# Patient Record
Sex: Male | Born: 1945 | Race: White | Hispanic: No | Marital: Married | Smoking: Former smoker
Health system: Southern US, Community
[De-identification: ages and names within clinical notes are randomized; demographics above are authoritative.]

## PROBLEM LIST (undated history)

## (undated) DIAGNOSIS — F0390 Unspecified dementia without behavioral disturbance: Secondary | ICD-10-CM

## (undated) DIAGNOSIS — G629 Polyneuropathy, unspecified: Secondary | ICD-10-CM

## (undated) DIAGNOSIS — F4310 Post-traumatic stress disorder, unspecified: Secondary | ICD-10-CM

## (undated) DIAGNOSIS — R918 Other nonspecific abnormal finding of lung field: Secondary | ICD-10-CM

---

## 2004-04-22 ENCOUNTER — Ambulatory Visit (HOSPITAL_COMMUNITY): Admission: RE | Admit: 2004-04-22 | Discharge: 2004-04-22 | Payer: Self-pay | Admitting: Ophthalmology

## 2005-12-23 ENCOUNTER — Emergency Department (HOSPITAL_COMMUNITY): Admission: EM | Admit: 2005-12-23 | Discharge: 2005-12-23 | Payer: Self-pay | Admitting: Emergency Medicine

## 2007-04-01 ENCOUNTER — Emergency Department (HOSPITAL_COMMUNITY): Admission: EM | Admit: 2007-04-01 | Discharge: 2007-04-01 | Payer: Self-pay | Admitting: Emergency Medicine

## 2008-04-09 ENCOUNTER — Emergency Department (HOSPITAL_COMMUNITY): Admission: EM | Admit: 2008-04-09 | Discharge: 2008-04-09 | Payer: Self-pay | Admitting: Internal Medicine

## 2009-03-05 ENCOUNTER — Emergency Department (HOSPITAL_COMMUNITY): Admission: EM | Admit: 2009-03-05 | Discharge: 2009-03-05 | Payer: Self-pay | Admitting: Emergency Medicine

## 2009-03-10 ENCOUNTER — Emergency Department (HOSPITAL_COMMUNITY): Admission: EM | Admit: 2009-03-10 | Discharge: 2009-03-10 | Payer: Self-pay | Admitting: Emergency Medicine

## 2009-03-27 ENCOUNTER — Emergency Department: Payer: Self-pay | Admitting: Emergency Medicine

## 2009-04-06 ENCOUNTER — Emergency Department: Payer: Self-pay | Admitting: Emergency Medicine

## 2009-12-17 IMAGING — CR DG HAND COMPLETE 3+V*L*
3 series · 3 of 3 positions shown · non-contrast
Comparison: None

CLINICAL DATA: Penetrating nail gun injury.

LEFT HAND - COMPLETE 3+ VIEW

[view not recorded (1 of 3)]
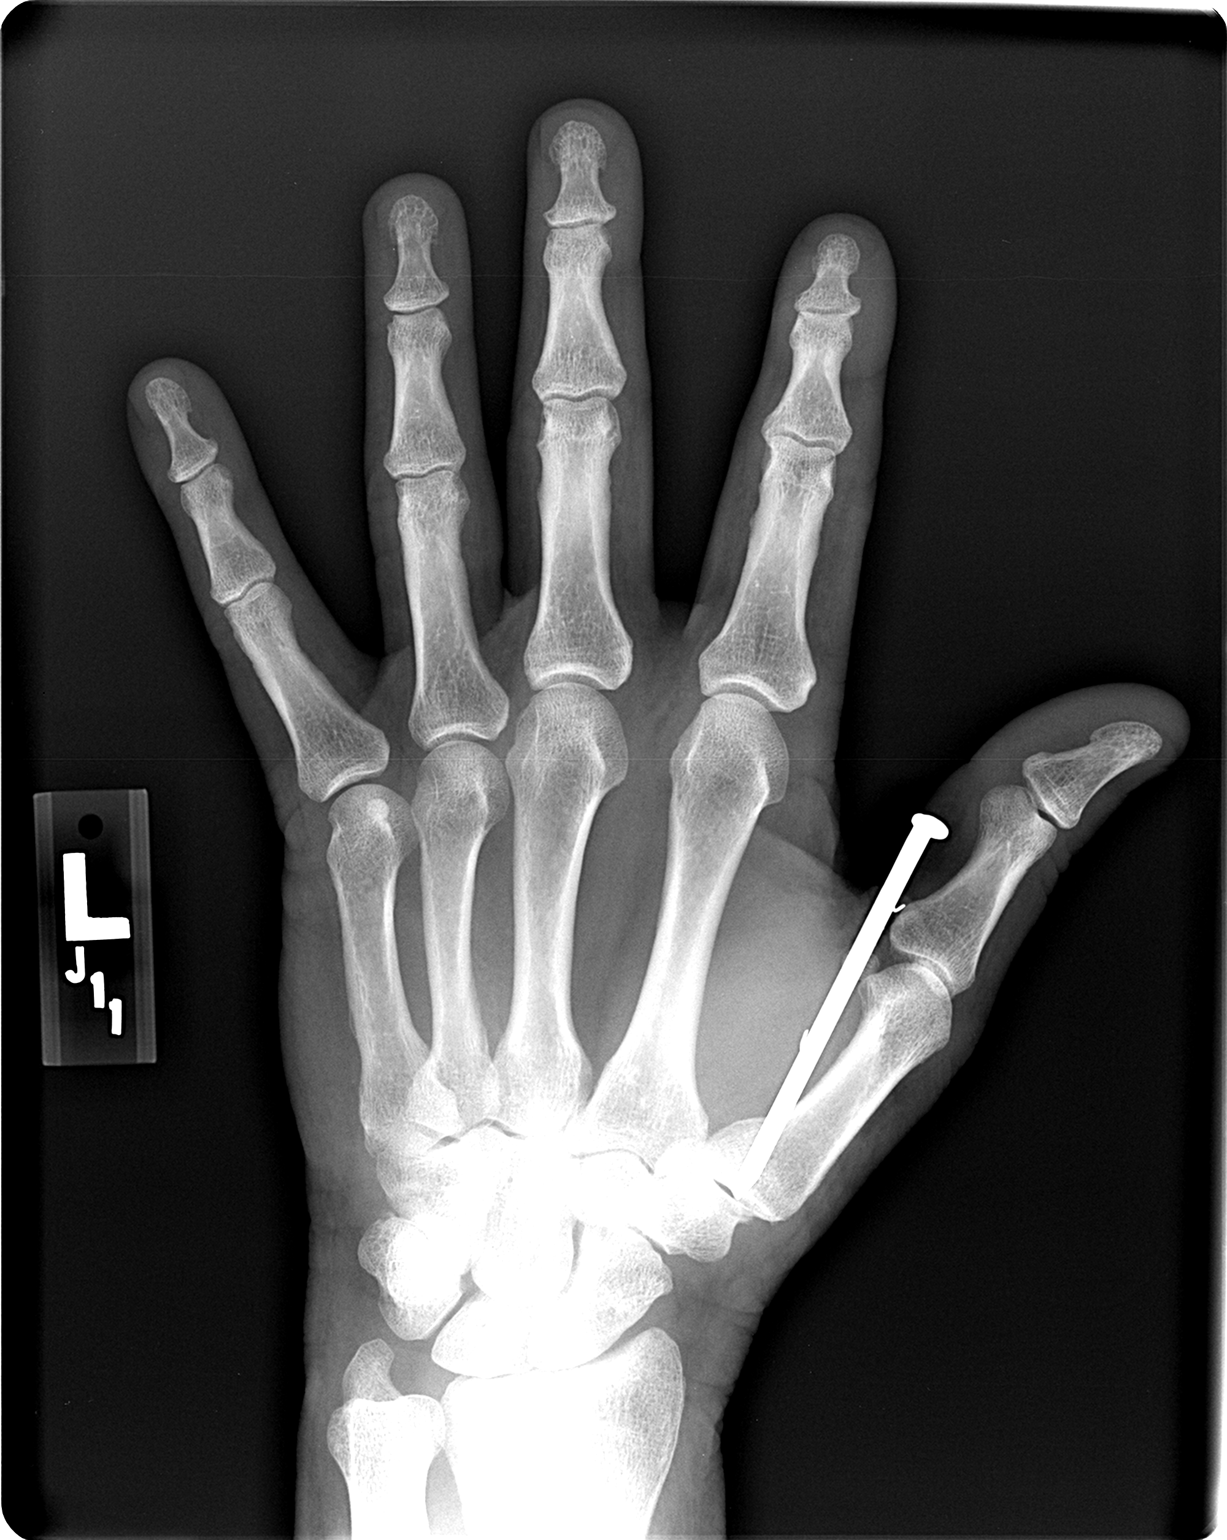

[view not recorded (2 of 3)]
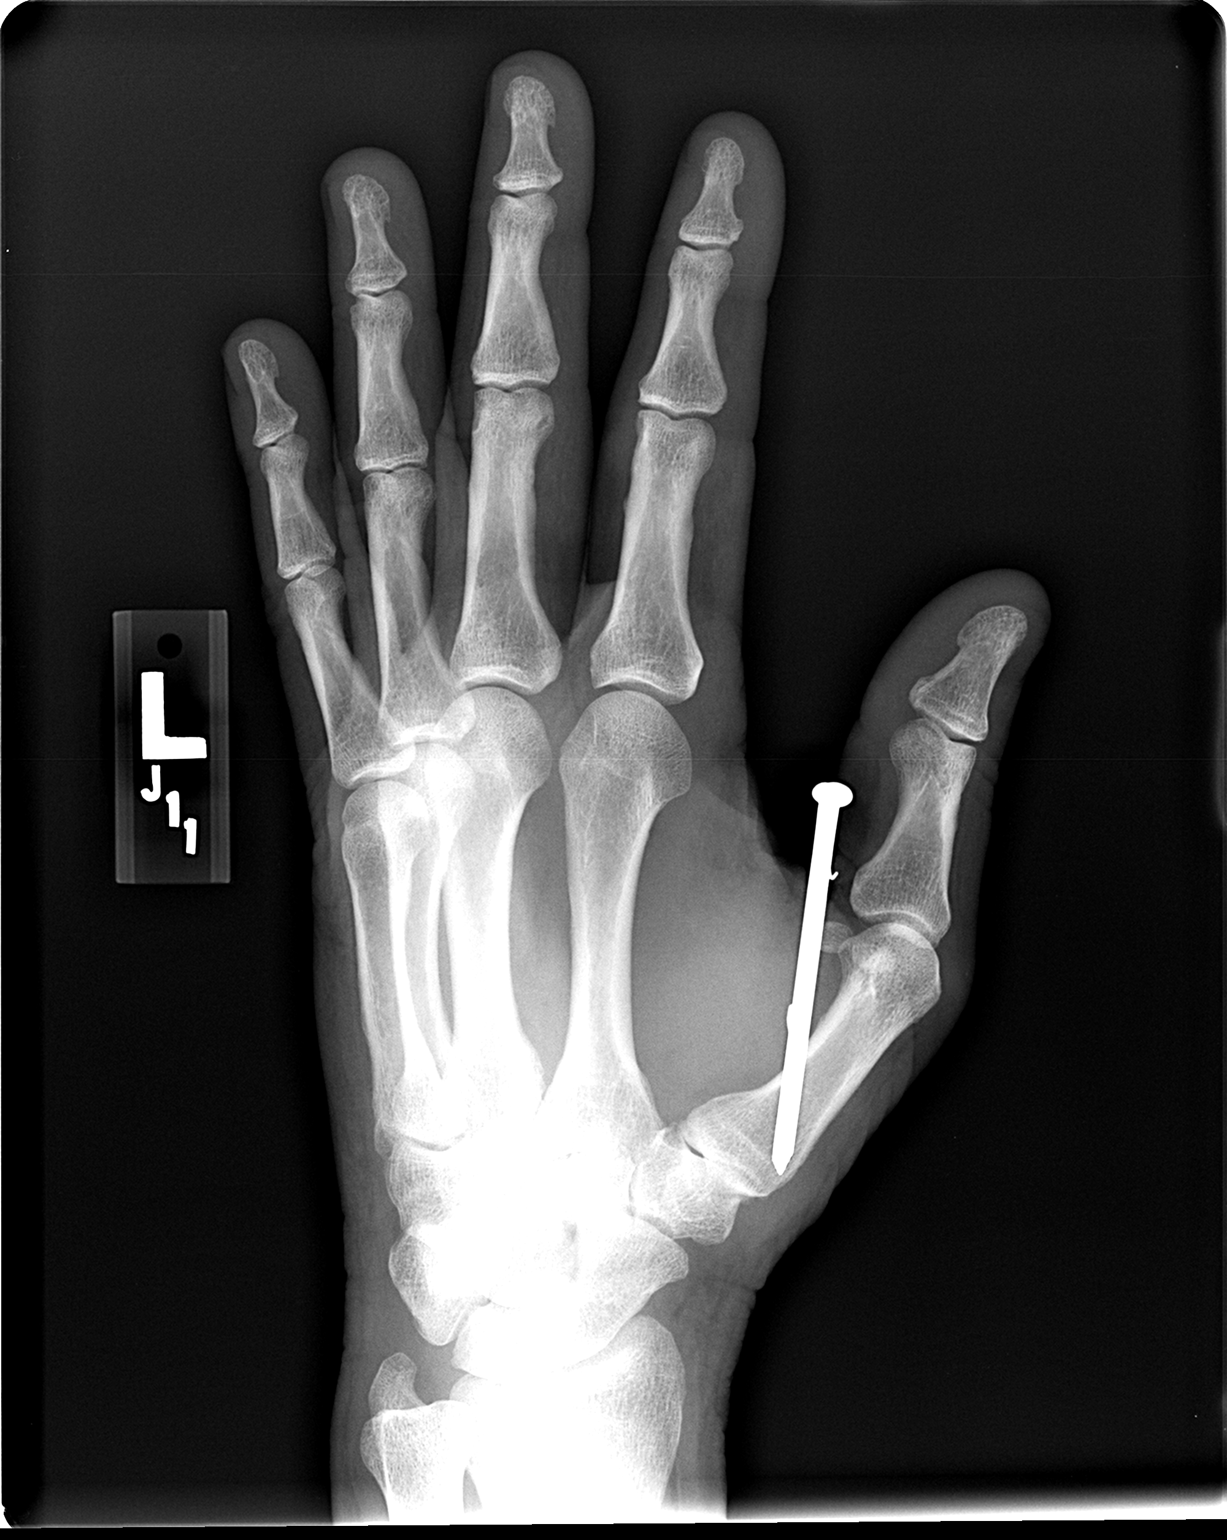

[view not recorded (3 of 3)]
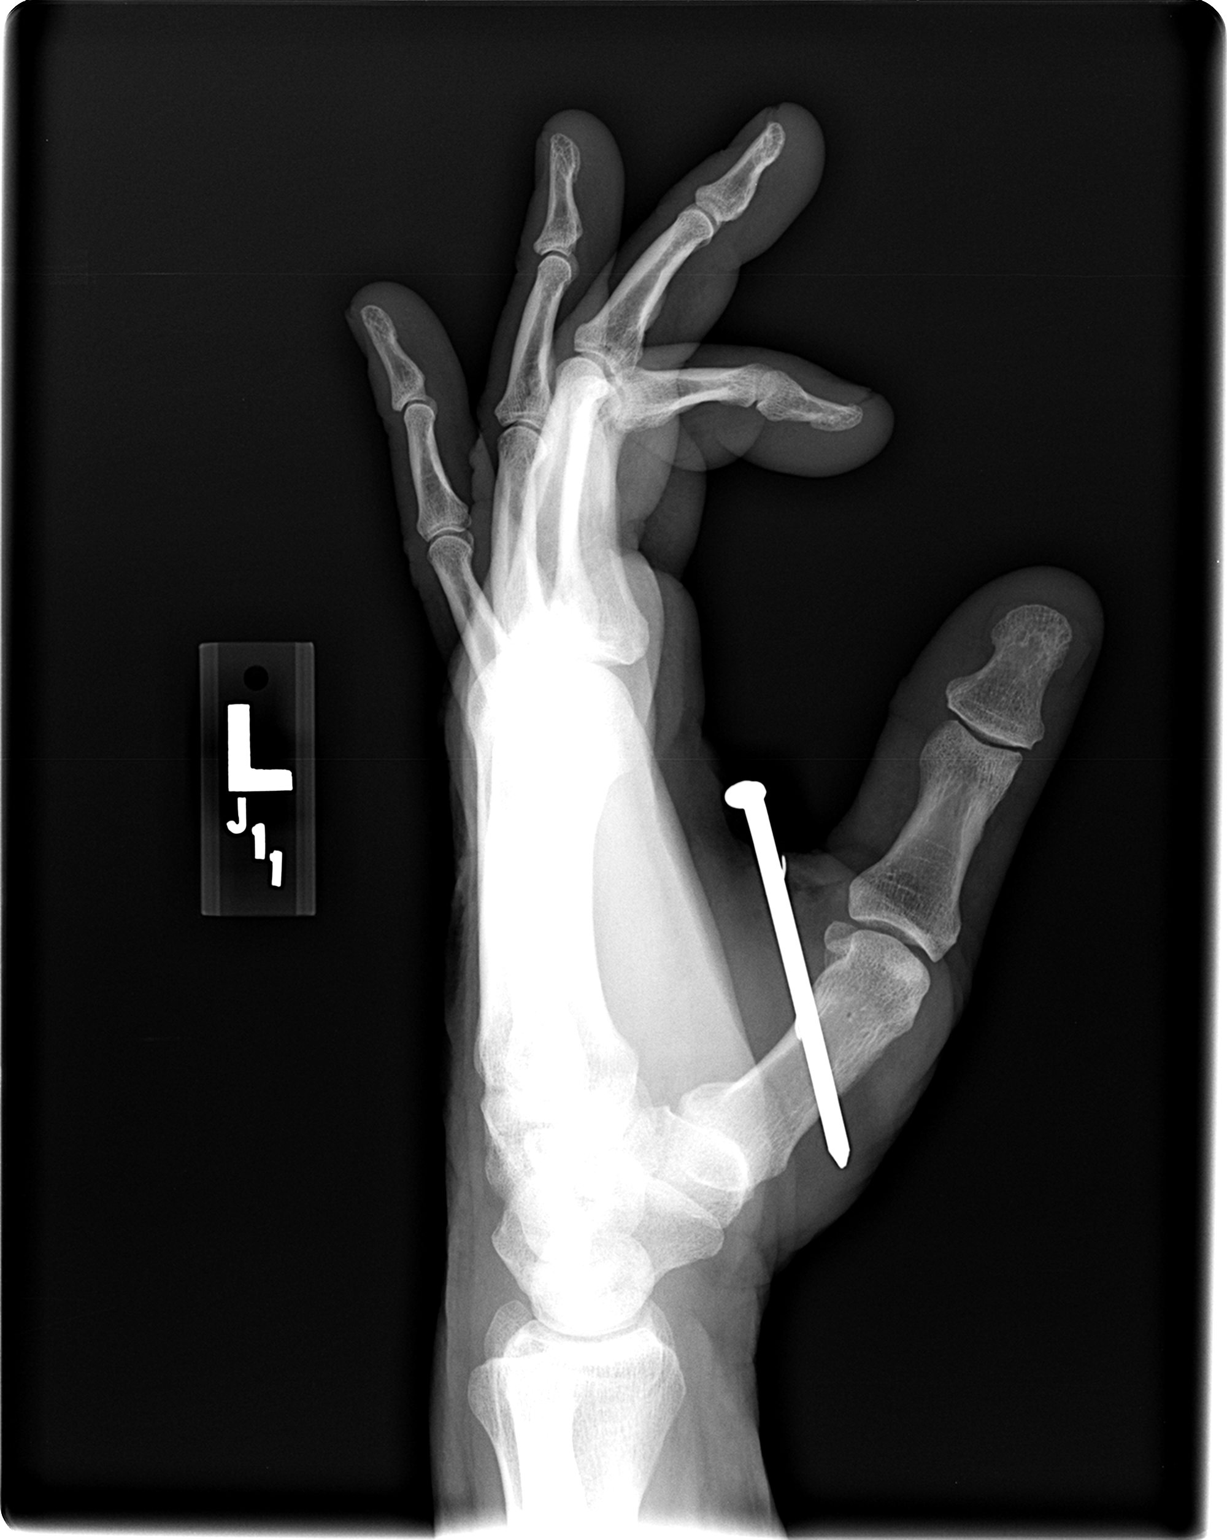

[3 of 3 positions shown; findings below may reference images not displayed]

FINDINGS: A 7 mm nail is lodged in the soft tissues overlying the
first metacarpal.  No associated fracture.
IMPRESSION: A nail is lodged in the soft tissues at the base of the thumb.  No
associated fracture.

## 2010-06-04 NOTE — Op Note (Signed)
NAMELEAM, MADERO              ACCOUNT NO.:  [id number]   MEDICAL RECORD NO.:  [id number]          PATIENT TYPE:  AMB   LOCATION:  DAY                           FACILITY:  [facility]   PHYSICIAN:  Trish Fountain, MD    DATE OF BIRTH:  11/26/1945   DATE OF PROCEDURE:  04/22/2004  DATE OF DISCHARGE:  04/22/2004                                 OPERATIVE REPORT   PREOPERATIVE DIAGNOSIS:  Cataract, right eye.   POSTOPERATIVE DIAGNOSIS:  Cataract, right eye.   SURGERY:  Kelman phacoemulsification, right eye, with posterior chamber  intraocular lens, right eye.   ANESTHESIA:  MAC with topical anesthesia of the right eye.   SURGEON:  Trish Fountain, M.D.   SPECIMENS:  None.   COMPLICATIONS:  None.   HISTORY:  This is a 65 year old male who has had slowly progressive decrease  in vision in the right eye and is unable to work secondary to his decrease  in vision in the right eye.   In addition, the model number of posterior chamber intraocular lens is  CLRFLXC 20.0 diopter lens. Serial number is [id number].   DESCRIPTION OF PROCEDURE:  In the preoperative area, the patient had  Cyclogyl and Neo-Synephrine drops in the right eye in order to dilate the  eye along with tetracaine drops to help anesthetize the eye. Once the  patient's right eye was dilated, the patient was taken to the operating room  and prepped. The right eye was prepped and draped in the usual sterile  manner. A lid speculum was placed in the right eye, and 2% Xylocaine jelly  was placed in the right eye as well. A paracentesis was made through clear  cornea at the limbus at approximately the 11 o'clock position of the right  eye. Nonpreserved Xylocaine 1% 1 mL was placed into the anterior chamber of  the right eye for one minute. Viscoat was then used to fill the anterior  chamber. Using an 2.75-mm blade at the 9 o'clock position, an incision into  the anterior chamber was made through clear cornea near the limbus.  Viscoat  was again used to reform the anterior chamber. A 25-gauge bent capsulotomy  needle was used to begin the capsulorrhexis through the anterior capsule of  the lens. Utrata forceps were used to make a 360-degree anterior  capsulorrhexis. A Chang 27-gauge irrigating cannula was used to hydrodissect  and hydrodelineate the lens nucleus. Once hydrodissection and  hydrodelineation was carried out, [facility] phacoemulsification was used to  make a deep groove in the lens nucleus. The lens was rotated 360 degrees and  divided into four quadrants using deep grooves made by phacoemulsification  with the [facility] phacoemulsification tip. The nucleus was then divided using  the phacoemulsification tip and the nucleus manipulator. The nuclear  quadrants were then removed using the phacoemulsification. The  irrigation/aspiration was then used to remove the remainder of the cortex.  An olive-tip capsule polisher was used to clean the remainder of the  cortical remnants from the posterior capsule. The anterior chamber and  posterior capsule was filled with Viscoat, and the  9 o'clock position  incision was slightly widened using the same 2.75-mm blade that was  initially used to make the initial incision at the 9 o'clock position. An  intraocular lens was placed in the shooter, and this was placed in the eye,  followed by placement of the trailing haptic into the posterior capsule  using the [facility]. Irrigation/aspiration was then used to remove Viscoat  from the anterior chamber and the posterior capsule. BSS on a syringe was  then used to hydrate the cornea at the 9 o'clock position incision site. The  incision site was then checked for water tightness using a Weck-Cel. Half-  strength Betadine solution was placed, one drop in the inner canthus and one  drop in the outer canthus. After one minute, this was rinsed from the eye.  Drops were placed in the eye. Vigamox followed by Nevanac followed by   Econopred. A shield was placed over the patient's right eye, and the patient  was sent to the recovery room in satisfactory condition.      PVK/MEDQ  D:  05/10/2004  T:  05/10/2004  Job:  [id number]

## 2012-03-18 ENCOUNTER — Emergency Department (HOSPITAL_COMMUNITY)
Admission: EM | Admit: 2012-03-18 | Discharge: 2012-03-19 | Disposition: A | Discharge status: Home / Self Care | Payer: Medicare Other | Attending: Emergency Medicine | Admitting: Emergency Medicine

## 2012-03-18 DIAGNOSIS — Z0000 Encounter for general adult medical examination without abnormal findings: Secondary | ICD-10-CM

## 2012-03-18 DIAGNOSIS — Z046 Encounter for general psychiatric examination, requested by authority: Secondary | ICD-10-CM | POA: Insufficient documentation
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
## 2012-03-19 LAB — CBC WITH DIFFERENTIAL/PLATELET
Eosinophils Relative: 4 % (ref 0–5)
HCT: 44.5 % (ref 39.0–52.0)
Lymphocytes Relative: 27 % (ref 12–46)
Lymphs Abs: 2.1 10*3/uL (ref 0.7–4.0)
MCV: 87.9 fL (ref 78.0–100.0)
Platelets: 226 10*3/uL (ref 150–400)
RBC: 5.06 MIL/uL (ref 4.22–5.81)
WBC: 7.8 10*3/uL (ref 4.0–10.5)

## 2012-03-19 LAB — ETHANOL: Alcohol, Ethyl (B): <11 mg/dL (ref 0–11)

## 2012-03-19 LAB — URINALYSIS, ROUTINE W REFLEX MICROSCOPIC
Ketones, ur: NEGATIVE mg/dL
Leukocytes, UA: NEGATIVE
Nitrite: NEGATIVE
Protein, ur: NEGATIVE mg/dL

## 2012-03-19 LAB — BASIC METABOLIC PANEL
BUN: 13 mg/dL (ref 6–23)
CO2: 26 mEq/L (ref 19–32)
Chloride: 98 mEq/L (ref 96–112)
Creatinine, Ser: 0.96 mg/dL (ref 0.50–1.35)
GFR calc Af Amer: >90 mL/min (ref >90)
GFR calc non Af Amer: 84 mL/min — ABNORMAL LOW (ref >90)
Glucose, Bld: 121 mg/dL — ABNORMAL HIGH (ref 70–99)
Sodium: 134 mEq/L — ABNORMAL LOW (ref 135–145)

## 2012-03-19 LAB — RAPID URINE DRUG SCREEN, HOSP PERFORMED: Amphetamines: NOT DETECTED
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
## 2013-01-18 DIAGNOSIS — N529 Male erectile dysfunction, unspecified: Secondary | ICD-10-CM | POA: Diagnosis not present

## 2013-01-18 DIAGNOSIS — F528 Other sexual dysfunction not due to a substance or known physiological condition: Secondary | ICD-10-CM | POA: Diagnosis not present

## 2013-04-12 DIAGNOSIS — N529 Male erectile dysfunction, unspecified: Secondary | ICD-10-CM | POA: Diagnosis not present

## 2013-09-17 DIAGNOSIS — G4700 Insomnia, unspecified: Secondary | ICD-10-CM | POA: Diagnosis not present

## 2013-11-20 DIAGNOSIS — F329 Major depressive disorder, single episode, unspecified: Secondary | ICD-10-CM | POA: Diagnosis not present

## 2013-11-20 DIAGNOSIS — N429 Disorder of prostate, unspecified: Secondary | ICD-10-CM | POA: Diagnosis not present

## 2013-11-20 DIAGNOSIS — K222 Esophageal obstruction: Secondary | ICD-10-CM | POA: Diagnosis not present

## 2013-11-25 DIAGNOSIS — N529 Male erectile dysfunction, unspecified: Secondary | ICD-10-CM | POA: Diagnosis not present

## 2014-01-08 DIAGNOSIS — R42 Dizziness and giddiness: Secondary | ICD-10-CM | POA: Diagnosis not present

## 2014-02-24 DIAGNOSIS — T887XXA Unspecified adverse effect of drug or medicament, initial encounter: Secondary | ICD-10-CM | POA: Diagnosis not present

## 2014-02-24 DIAGNOSIS — R55 Syncope and collapse: Secondary | ICD-10-CM | POA: Diagnosis not present

## 2014-02-24 DIAGNOSIS — G4700 Insomnia, unspecified: Secondary | ICD-10-CM | POA: Diagnosis not present

## 2014-02-24 DIAGNOSIS — R42 Dizziness and giddiness: Secondary | ICD-10-CM | POA: Diagnosis not present

## 2014-03-07 DIAGNOSIS — R55 Syncope and collapse: Secondary | ICD-10-CM | POA: Diagnosis not present

## 2014-03-07 DIAGNOSIS — R42 Dizziness and giddiness: Secondary | ICD-10-CM | POA: Diagnosis not present

## 2014-03-17 DIAGNOSIS — R739 Hyperglycemia, unspecified: Secondary | ICD-10-CM | POA: Diagnosis not present

## 2014-03-18 DIAGNOSIS — R7309 Other abnormal glucose: Secondary | ICD-10-CM | POA: Diagnosis not present

## 2014-03-18 DIAGNOSIS — G4700 Insomnia, unspecified: Secondary | ICD-10-CM | POA: Diagnosis not present

## 2014-03-18 DIAGNOSIS — K635 Polyp of colon: Secondary | ICD-10-CM | POA: Diagnosis not present

## 2014-03-18 DIAGNOSIS — F329 Major depressive disorder, single episode, unspecified: Secondary | ICD-10-CM | POA: Diagnosis not present

## 2014-03-24 DIAGNOSIS — R55 Syncope and collapse: Secondary | ICD-10-CM | POA: Diagnosis not present

## 2014-07-04 DIAGNOSIS — N529 Male erectile dysfunction, unspecified: Secondary | ICD-10-CM | POA: Diagnosis not present

## 2014-07-04 DIAGNOSIS — F4310 Post-traumatic stress disorder, unspecified: Secondary | ICD-10-CM | POA: Diagnosis not present

## 2014-07-22 DIAGNOSIS — F4310 Post-traumatic stress disorder, unspecified: Secondary | ICD-10-CM | POA: Diagnosis not present

## 2014-07-22 DIAGNOSIS — Z720 Tobacco use: Secondary | ICD-10-CM | POA: Diagnosis not present

## 2014-07-22 DIAGNOSIS — R739 Hyperglycemia, unspecified: Secondary | ICD-10-CM | POA: Diagnosis not present

## 2014-07-22 DIAGNOSIS — K219 Gastro-esophageal reflux disease without esophagitis: Secondary | ICD-10-CM | POA: Diagnosis not present

## 2014-08-07 DIAGNOSIS — K219 Gastro-esophageal reflux disease without esophagitis: Secondary | ICD-10-CM | POA: Diagnosis not present

## 2014-08-07 DIAGNOSIS — F4310 Post-traumatic stress disorder, unspecified: Secondary | ICD-10-CM | POA: Diagnosis not present

## 2014-08-07 DIAGNOSIS — M25561 Pain in right knee: Secondary | ICD-10-CM | POA: Diagnosis not present

## 2014-08-07 DIAGNOSIS — Z720 Tobacco use: Secondary | ICD-10-CM | POA: Diagnosis not present

## 2014-08-07 DIAGNOSIS — G4700 Insomnia, unspecified: Secondary | ICD-10-CM | POA: Diagnosis not present

## 2014-08-07 DIAGNOSIS — H547 Unspecified visual loss: Secondary | ICD-10-CM | POA: Diagnosis not present

## 2014-08-07 DIAGNOSIS — R739 Hyperglycemia, unspecified: Secondary | ICD-10-CM | POA: Diagnosis not present

## 2014-08-15 DIAGNOSIS — R739 Hyperglycemia, unspecified: Secondary | ICD-10-CM | POA: Diagnosis not present

## 2014-08-15 DIAGNOSIS — F4310 Post-traumatic stress disorder, unspecified: Secondary | ICD-10-CM | POA: Diagnosis not present

## 2014-08-15 DIAGNOSIS — G4700 Insomnia, unspecified: Secondary | ICD-10-CM | POA: Diagnosis not present

## 2014-08-18 DIAGNOSIS — F4310 Post-traumatic stress disorder, unspecified: Secondary | ICD-10-CM | POA: Diagnosis not present

## 2014-08-22 DIAGNOSIS — F4310 Post-traumatic stress disorder, unspecified: Secondary | ICD-10-CM | POA: Diagnosis not present

## 2014-08-25 DIAGNOSIS — F4310 Post-traumatic stress disorder, unspecified: Secondary | ICD-10-CM | POA: Diagnosis not present

## 2014-08-29 DIAGNOSIS — F4310 Post-traumatic stress disorder, unspecified: Secondary | ICD-10-CM | POA: Diagnosis not present

## 2014-09-09 DIAGNOSIS — F4310 Post-traumatic stress disorder, unspecified: Secondary | ICD-10-CM | POA: Diagnosis not present

## 2014-09-12 DIAGNOSIS — F4310 Post-traumatic stress disorder, unspecified: Secondary | ICD-10-CM | POA: Diagnosis not present

## 2014-10-20 DIAGNOSIS — F4310 Post-traumatic stress disorder, unspecified: Secondary | ICD-10-CM | POA: Diagnosis not present

## 2015-02-18 DIAGNOSIS — Z760 Encounter for issue of repeat prescription: Secondary | ICD-10-CM | POA: Diagnosis not present

## 2015-02-20 DIAGNOSIS — F4310 Post-traumatic stress disorder, unspecified: Secondary | ICD-10-CM | POA: Diagnosis not present

## 2015-03-12 DIAGNOSIS — R55 Syncope and collapse: Secondary | ICD-10-CM | POA: Diagnosis not present

## 2015-03-12 DIAGNOSIS — Z5321 Procedure and treatment not carried out due to patient leaving prior to being seen by health care provider: Secondary | ICD-10-CM | POA: Diagnosis not present

## 2015-05-07 DIAGNOSIS — R269 Unspecified abnormalities of gait and mobility: Secondary | ICD-10-CM | POA: Diagnosis not present

## 2015-05-07 DIAGNOSIS — R251 Tremor, unspecified: Secondary | ICD-10-CM | POA: Diagnosis not present

## 2015-06-24 DIAGNOSIS — M5412 Radiculopathy, cervical region: Secondary | ICD-10-CM | POA: Diagnosis not present

## 2015-06-24 DIAGNOSIS — R531 Weakness: Secondary | ICD-10-CM | POA: Diagnosis not present

## 2015-06-24 DIAGNOSIS — G250 Essential tremor: Secondary | ICD-10-CM | POA: Diagnosis not present

## 2015-06-24 DIAGNOSIS — R2689 Other abnormalities of gait and mobility: Secondary | ICD-10-CM | POA: Diagnosis not present

## 2015-11-06 DIAGNOSIS — S0990XA Unspecified injury of head, initial encounter: Secondary | ICD-10-CM | POA: Diagnosis not present

## 2015-11-06 DIAGNOSIS — F0390 Unspecified dementia without behavioral disturbance: Secondary | ICD-10-CM | POA: Diagnosis not present

## 2015-11-06 DIAGNOSIS — S0993XA Unspecified injury of face, initial encounter: Secondary | ICD-10-CM | POA: Diagnosis not present

## 2015-11-06 DIAGNOSIS — M47812 Spondylosis without myelopathy or radiculopathy, cervical region: Secondary | ICD-10-CM | POA: Diagnosis not present

## 2015-11-06 DIAGNOSIS — D696 Thrombocytopenia, unspecified: Secondary | ICD-10-CM | POA: Diagnosis not present

## 2015-11-06 DIAGNOSIS — S199XXA Unspecified injury of neck, initial encounter: Secondary | ICD-10-CM | POA: Diagnosis not present

## 2015-11-06 DIAGNOSIS — S161XXA Strain of muscle, fascia and tendon at neck level, initial encounter: Secondary | ICD-10-CM | POA: Diagnosis not present

## 2015-11-06 DIAGNOSIS — W0110XA Fall on same level from slipping, tripping and stumbling with subsequent striking against unspecified object, initial encounter: Secondary | ICD-10-CM | POA: Diagnosis not present

## 2015-11-06 DIAGNOSIS — J0100 Acute maxillary sinusitis, unspecified: Secondary | ICD-10-CM | POA: Diagnosis not present

## 2015-11-06 DIAGNOSIS — S299XXA Unspecified injury of thorax, initial encounter: Secondary | ICD-10-CM | POA: Diagnosis not present

## 2016-03-15 DIAGNOSIS — Z79899 Other long term (current) drug therapy: Secondary | ICD-10-CM | POA: Diagnosis not present

## 2016-03-15 DIAGNOSIS — F4310 Post-traumatic stress disorder, unspecified: Secondary | ICD-10-CM | POA: Diagnosis not present

## 2016-05-16 DIAGNOSIS — Z6827 Body mass index (BMI) 27.0-27.9, adult: Secondary | ICD-10-CM | POA: Diagnosis not present

## 2016-05-16 DIAGNOSIS — S80862A Insect bite (nonvenomous), left lower leg, initial encounter: Secondary | ICD-10-CM | POA: Diagnosis not present

## 2016-10-15 DIAGNOSIS — F0390 Unspecified dementia without behavioral disturbance: Secondary | ICD-10-CM | POA: Diagnosis not present

## 2016-10-15 DIAGNOSIS — J9811 Atelectasis: Secondary | ICD-10-CM | POA: Diagnosis not present

## 2016-10-15 DIAGNOSIS — R05 Cough: Secondary | ICD-10-CM | POA: Diagnosis not present

## 2016-10-15 DIAGNOSIS — J449 Chronic obstructive pulmonary disease, unspecified: Secondary | ICD-10-CM | POA: Diagnosis not present

## 2016-10-15 DIAGNOSIS — R404 Transient alteration of awareness: Secondary | ICD-10-CM | POA: Diagnosis not present

## 2016-10-15 DIAGNOSIS — F05 Delirium due to known physiological condition: Secondary | ICD-10-CM | POA: Diagnosis not present

## 2016-10-15 DIAGNOSIS — N390 Urinary tract infection, site not specified: Secondary | ICD-10-CM | POA: Diagnosis not present

## 2016-10-15 DIAGNOSIS — R319 Hematuria, unspecified: Secondary | ICD-10-CM | POA: Diagnosis not present

## 2016-10-15 DIAGNOSIS — Z79899 Other long term (current) drug therapy: Secondary | ICD-10-CM | POA: Diagnosis not present

## 2016-10-15 DIAGNOSIS — R531 Weakness: Secondary | ICD-10-CM | POA: Diagnosis not present

## 2016-11-17 DIAGNOSIS — Z9981 Dependence on supplemental oxygen: Secondary | ICD-10-CM | POA: Diagnosis not present

## 2016-11-17 DIAGNOSIS — Z87891 Personal history of nicotine dependence: Secondary | ICD-10-CM | POA: Diagnosis not present

## 2016-11-17 DIAGNOSIS — Z79899 Other long term (current) drug therapy: Secondary | ICD-10-CM | POA: Diagnosis not present

## 2016-11-17 DIAGNOSIS — F0390 Unspecified dementia without behavioral disturbance: Secondary | ICD-10-CM | POA: Diagnosis not present

## 2016-11-17 DIAGNOSIS — R0781 Pleurodynia: Secondary | ICD-10-CM | POA: Diagnosis not present

## 2016-11-17 DIAGNOSIS — J449 Chronic obstructive pulmonary disease, unspecified: Secondary | ICD-10-CM | POA: Diagnosis not present

## 2016-11-17 DIAGNOSIS — R079 Chest pain, unspecified: Secondary | ICD-10-CM | POA: Diagnosis not present

## 2016-11-17 DIAGNOSIS — R7989 Other specified abnormal findings of blood chemistry: Secondary | ICD-10-CM | POA: Diagnosis not present

## 2016-11-17 DIAGNOSIS — I700 Atherosclerosis of aorta: Secondary | ICD-10-CM | POA: Diagnosis not present

## 2016-11-17 DIAGNOSIS — R0789 Other chest pain: Secondary | ICD-10-CM | POA: Diagnosis not present

## 2017-04-04 DIAGNOSIS — Z0001 Encounter for general adult medical examination with abnormal findings: Secondary | ICD-10-CM | POA: Diagnosis not present

## 2017-04-04 DIAGNOSIS — Z6828 Body mass index (BMI) 28.0-28.9, adult: Secondary | ICD-10-CM | POA: Diagnosis not present

## 2017-11-02 ENCOUNTER — Other Ambulatory Visit: Payer: Self-pay

## 2017-11-02 ENCOUNTER — Encounter (HOSPITAL_COMMUNITY): Payer: Self-pay | Admitting: Emergency Medicine

## 2017-11-02 ENCOUNTER — Emergency Department (HOSPITAL_COMMUNITY): Payer: Medicare Other

## 2017-11-02 ENCOUNTER — Emergency Department (HOSPITAL_COMMUNITY)
Admission: EM | Admit: 2017-11-02 | Discharge: 2017-11-03 | Disposition: A | Discharge status: Home / Self Care | Payer: Medicare Other | Attending: Emergency Medicine | Admitting: Emergency Medicine

## 2017-11-02 DIAGNOSIS — R072 Precordial pain: Secondary | ICD-10-CM | POA: Diagnosis not present

## 2017-11-02 DIAGNOSIS — R079 Chest pain, unspecified: Secondary | ICD-10-CM | POA: Diagnosis not present

## 2017-11-02 DIAGNOSIS — F0390 Unspecified dementia without behavioral disturbance: Secondary | ICD-10-CM | POA: Insufficient documentation

## 2017-11-02 DIAGNOSIS — Z87891 Personal history of nicotine dependence: Secondary | ICD-10-CM | POA: Diagnosis not present

## 2017-11-02 HISTORY — DX: Unspecified dementia, unspecified severity, without behavioral disturbance, psychotic disturbance, mood disturbance, and anxiety: F03.90

## 2017-11-02 HISTORY — DX: Post-traumatic stress disorder, unspecified: F43.10

## 2017-11-02 HISTORY — DX: Other nonspecific abnormal finding of lung field: R91.8

## 2017-11-02 HISTORY — DX: Polyneuropathy, unspecified: G62.9

## 2017-11-02 LAB — CBC
HCT: 48.3 % (ref 39.0–52.0)
Hemoglobin: 15.2 g/dL (ref 13.0–17.0)
MCH: 28.9 pg (ref 26.0–34.0)
MCHC: 31.5 g/dL (ref 30.0–36.0)
MCV: 91.8 fL (ref 80.0–100.0)
NRBC: 0 % (ref 0.0–0.2)
PLATELETS: 189 10*3/uL (ref 150–400)
RBC: 5.26 MIL/uL (ref 4.22–5.81)
RDW: 14.6 % (ref 11.5–15.5)
WBC: 7.7 10*3/uL (ref 4.0–10.5)

## 2017-11-02 LAB — TROPONIN I

## 2017-11-02 LAB — BASIC METABOLIC PANEL
ANION GAP: 7 (ref 5–15)
BUN: 11 mg/dL (ref 8–23)
CALCIUM: 8.9 mg/dL (ref 8.9–10.3)
CO2: 29 mmol/L (ref 22–32)
CREATININE: 1.3 mg/dL — AB (ref 0.61–1.24)
Chloride: 104 mmol/L (ref 98–111)
GFR calc Af Amer: >60 mL/min (ref >60)
GFR, EST NON AFRICAN AMERICAN: 54 mL/min — AB (ref >60)
Glucose, Bld: 110 mg/dL — ABNORMAL HIGH (ref 70–99)
Potassium: 3.9 mmol/L (ref 3.5–5.1)
Sodium: 140 mmol/L (ref 135–145)

## 2017-11-02 LAB — D-DIMER, QUANTITATIVE (NOT AT ARMC): D DIMER QUANT: 0.39 ug{FEU}/mL (ref 0.00–0.50)

## 2017-11-02 NOTE — Discharge Instructions (Signed)

## 2017-11-02 NOTE — ED Triage Notes (Signed)
Pt c/o of left sided chest pain that radiates to his back with tenderness on palpation.

## 2017-11-02 NOTE — ED Provider Notes (Signed)
Emergency Department Provider Note   I have reviewed the triage vital signs and the nursing notes.   HISTORY  Chief Complaint Chest Pain   HPI Justin GERST is a 72 y.o. male with PMH of dementia, lung mass, PTSD, and peripheral neuropathy presents to the emergency department for evaluation of sharp, left chest pain which radiates to the back.  Symptoms have been ongoing for the past several days.  He reports mostly intermittent pain that is occasionally worse with breathing or movement.  Injury to the chest.  Denies any squeezing, pressure sensation.  No diaphoresis or nausea.  He has not had any fevers or shaking chills.  No productive cough or hemoptysis. No similar pain in the past. No history of CAD.   Past Medical History:  Diagnosis Date  . Dementia (HCC)   . Lung mass   . Peripheral neuropathy   . PTSD (post-traumatic stress disorder)     There are no active problems to display for this patient.   History reviewed. No pertinent surgical history.  Allergies Benadryl [diphenhydramine]  No family history on file.  Social History Social History   Tobacco Use  . Smoking status: Former Games developer  . Smokeless tobacco: Never Used  Substance Use Topics  . Alcohol use: Never    Frequency: Never  . Drug use: Never    Review of Systems  Constitutional: No fever/chills Eyes: No visual changes. ENT: No sore throat. Cardiovascular: Positive chest pain. Respiratory: Denies shortness of breath. Gastrointestinal: No abdominal pain.  No nausea, no vomiting.  No diarrhea.  No constipation. Genitourinary: Negative for dysuria. Musculoskeletal: Negative for back pain. Skin: Negative for rash. Neurological: Negative for headaches, focal weakness or numbness.  10-point ROS otherwise negative.  ____________________________________________   PHYSICAL EXAM:  VITAL SIGNS: ED Triage Vitals [11/02/17 1804]  Enc Vitals Group     BP 120/71     Pulse Rate (!) 102   Resp 20     Temp 97.9 F (36.6 C)     Temp Source Oral     SpO2 98 %     Weight 220 lb (99.8 kg)     Height 6\' 2"  (1.88 m)     Pain Score 8   Constitutional: Alert and oriented. Well appearing and in no acute distress. Eyes: Conjunctivae are normal.  Head: Atraumatic. Nose: No congestion/rhinnorhea. Mouth/Throat: Mucous membranes are moist. Neck: No stridor.   Cardiovascular: Normal rate, regular rhythm. Good peripheral circulation. Grossly normal heart sounds.   Respiratory: Normal respiratory effort.  No retractions. Lungs CTAB. Gastrointestinal: Soft and nontender. No distention.  Musculoskeletal: No lower extremity tenderness nor edema. No gross deformities of extremities. Neurologic:  Normal speech and language. No gross focal neurologic deficits are appreciated.  Skin:  Skin is warm, dry and intact. No rash noted.  ____________________________________________   LABS (all labs ordered are listed, but only abnormal results are displayed)  Labs Reviewed  BASIC METABOLIC PANEL - Abnormal; Notable for the following components:      Result Value   Glucose, Bld 110 (*)    Creatinine, Ser 1.30 (*)    GFR calc non Af Amer 54 (*)    All other components within normal limits  CBC  TROPONIN I  D-DIMER, QUANTITATIVE (NOT AT [facility])  TROPONIN I   ____________________________________________  EKG   EKG Interpretation  Date/Time:  Thursday November 02 2017 18:08:45 EDT Ventricular Rate:  103 PR Interval:  166 QRS Duration: 78 QT Interval:  338 QTC  Calculation: 442 R Axis:   47 Text Interpretation:  Sinus tachycardia Low voltage QRS Abnormal ECG No STEMI.  Confirmed by Alona Bene [phone]) on 11/02/2017 7:24:36 PM       ____________________________________________  RADIOLOGY  Dg Chest 2 View  Result Date: 11/02/2017 CLINICAL DATA:  Left-sided chest pain. EXAM: CHEST - 2 VIEW COMPARISON:  Body CT 11/17/2016 FINDINGS: Cardiomediastinal silhouette is normal. Mediastinal  contours appear intact. Calcific atherosclerotic disease of the aorta. There is no evidence of focal airspace consolidation, pleural effusion or pneumothorax. Left diaphragmatic pleural calcifications seen. Osseous structures are without acute abnormality. Soft tissues are grossly normal. IMPRESSION: No active cardiopulmonary disease. Left diaphragmatic pleural calcifications seen. Electronically Signed   By: Ted Mcalpine M.D.   On: 11/02/2017 18:37    ____________________________________________   PROCEDURES  Procedure(s) performed:   Procedures  None ____________________________________________   INITIAL IMPRESSION / ASSESSMENT AND PLAN / ED COURSE  Pertinent labs & imaging results that were available during my care of the patient were reviewed by me and considered in my medical decision making (see chart for details).  She presents to the emergency department with left chest pain which is sharp.  The description of pain is atypical for ACS.  Considering PE but feel this is less likely.  EKG reviewed with no acute findings.  Will obtain labs including d-dimer and troponin.  Despite the patient's age she has relatively low risk for CAD. HEART score 3.   Second troponin is negative. Pain appears to be MSK in origin but will have patient f/u with PCP and Cardiology given age.   At this time, I do not feel there is any life-threatening condition present. I have reviewed and discussed all results (EKG, imaging, lab, urine as appropriate), exam findings with patient. I have reviewed nursing notes and appropriate previous records.  I feel the patient is safe to be discharged home without further emergent workup. Discussed usual and customary return precautions. Patient and family (if present) verbalize understanding and are comfortable with this plan.  Patient will follow-up with their primary care provider. If they do not have a primary care provider, information for follow-up has been  provided to them. All questions have been answered.  ____________________________________________  FINAL CLINICAL IMPRESSION(S) / ED DIAGNOSES  Final diagnoses:  Precordial chest pain    Note:  This document was prepared using Dragon voice recognition software and may include unintentional dictation errors.  Alona Bene, MD Emergency Medicine    Terilynn Buresh, Arlyss Repress, MD 11/03/17 [phone]

## 2018-08-17 ENCOUNTER — Other Ambulatory Visit: Payer: Self-pay

## 2019-07-12 IMAGING — DX DG CHEST 2V
2 series · 2 of 2 positions shown · non-contrast
Comparison: Body CT 11/17/2016

CLINICAL DATA: Left-sided chest pain.

EXAM:
CHEST - 2 VIEW

[chest pa]
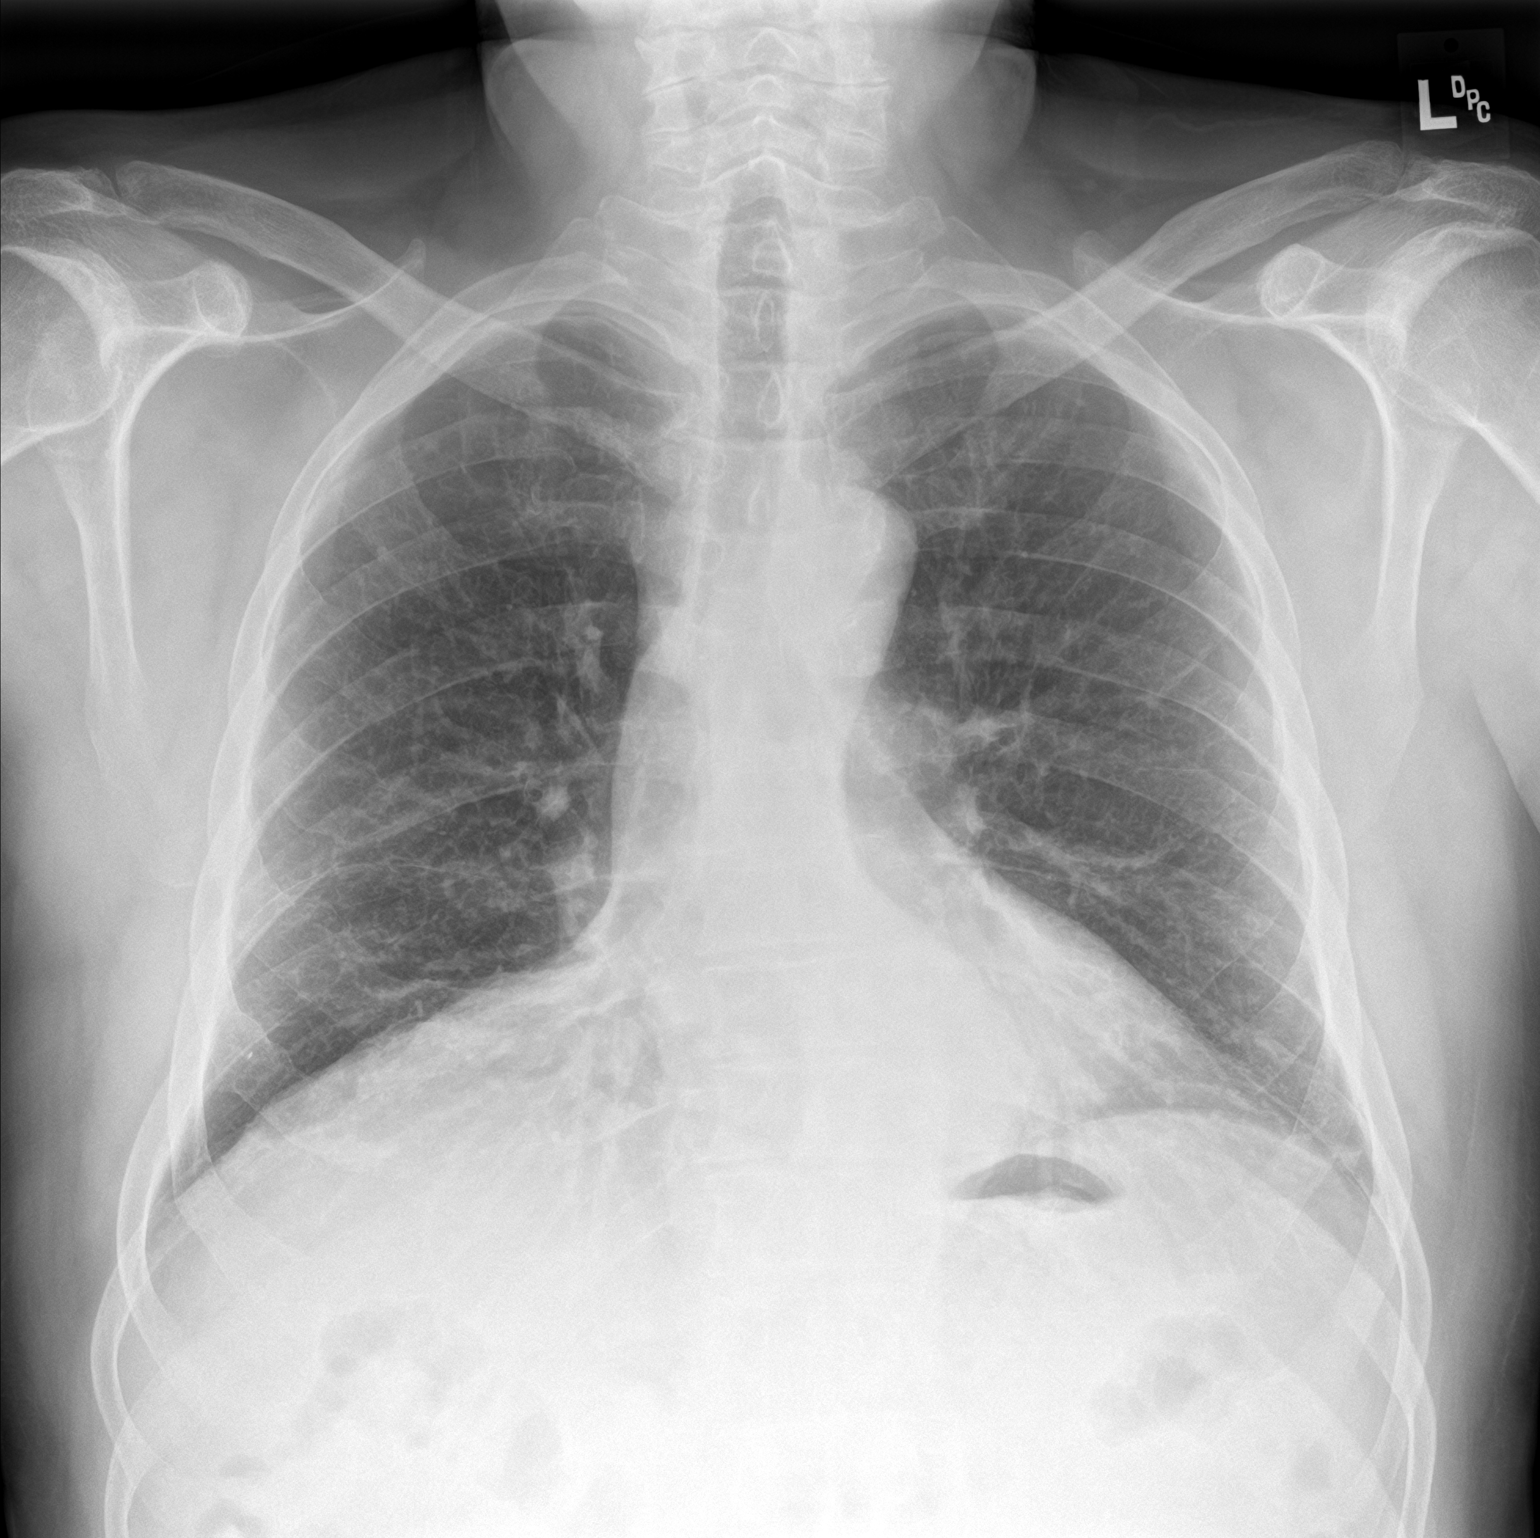

[chest lat]
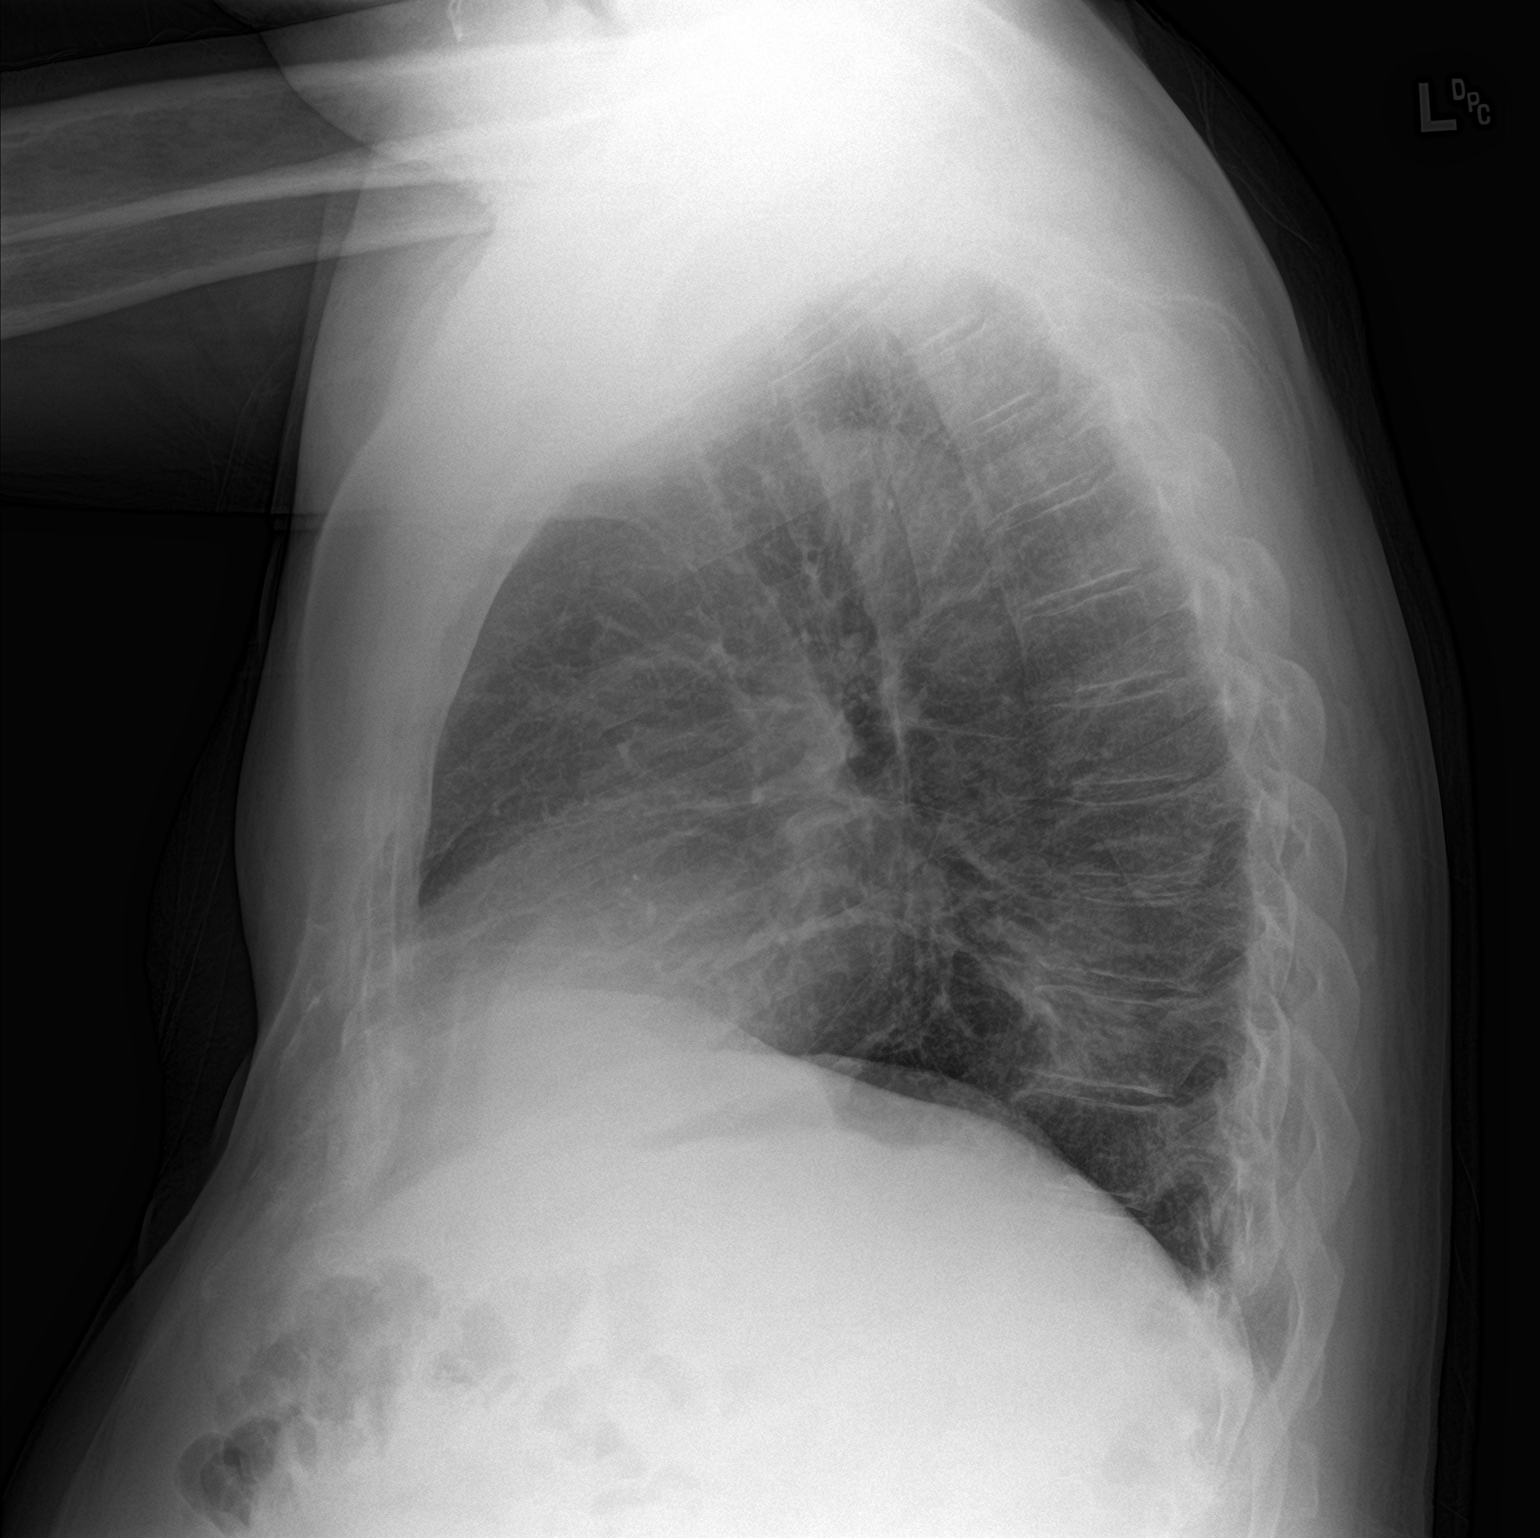

[2 of 2 positions shown; findings below may reference images not displayed]

FINDINGS: Cardiomediastinal silhouette is normal. Mediastinal contours appear
intact. Calcific atherosclerotic disease of the aorta.

There is no evidence of focal airspace consolidation, pleural
effusion or pneumothorax. Left diaphragmatic pleural calcifications
seen.

Osseous structures are without acute abnormality. Soft tissues are
grossly normal.
IMPRESSION: No active cardiopulmonary disease.

Left diaphragmatic pleural calcifications seen.
